# Patient Record
Sex: Female | Born: 1990 | Race: Black or African American | Hispanic: No | Marital: Single | Smoking: Former smoker
Health system: Southern US, Community
[De-identification: ages and names within clinical notes are randomized; demographics above are authoritative.]

## PROBLEM LIST (undated history)

## (undated) DIAGNOSIS — M549 Dorsalgia, unspecified: Secondary | ICD-10-CM

## (undated) HISTORY — PX: EYE SURGERY: SHX253

---

## 2017-01-27 ENCOUNTER — Emergency Department (HOSPITAL_COMMUNITY)
Admission: EM | Admit: 2017-01-27 | Discharge: 2017-01-27 | Disposition: A | Discharge status: Home / Self Care | Payer: No Typology Code available for payment source | Attending: Emergency Medicine | Admitting: Emergency Medicine

## 2017-01-27 ENCOUNTER — Encounter (HOSPITAL_COMMUNITY): Payer: Self-pay | Admitting: *Deleted

## 2017-01-27 DIAGNOSIS — M545 Low back pain: Secondary | ICD-10-CM | POA: Diagnosis not present

## 2017-01-27 DIAGNOSIS — Y939 Activity, unspecified: Secondary | ICD-10-CM | POA: Diagnosis not present

## 2017-01-27 DIAGNOSIS — Y999 Unspecified external cause status: Secondary | ICD-10-CM | POA: Diagnosis not present

## 2017-01-27 DIAGNOSIS — Y92410 Unspecified street and highway as the place of occurrence of the external cause: Secondary | ICD-10-CM | POA: Diagnosis not present

## 2017-01-27 MED ORDER — CYCLOBENZAPRINE HCL 10 MG PO TABS
10.0000 mg | ORAL_TABLET | Freq: Two times a day (BID) | ORAL | 0 refills | Status: AC | PRN
Start: 2017-01-27 — End: ?

## 2017-01-27 NOTE — Discharge Instructions (Signed)
Please read attached information. If you experience any new or worsening signs or symptoms please return to the emergency room for evaluation. Please follow-up with your primary care provider or specialist as discussed. Please use medication prescribed only as directed and discontinue taking if you have any concerning signs or symptoms.   °
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
## 2017-04-24 DIAGNOSIS — Z1159 Encounter for screening for other viral diseases: Secondary | ICD-10-CM | POA: Diagnosis not present

## 2017-04-24 DIAGNOSIS — R87610 Atypical squamous cells of undetermined significance on cytologic smear of cervix (ASC-US): Secondary | ICD-10-CM | POA: Diagnosis not present

## 2017-04-24 DIAGNOSIS — Z01419 Encounter for gynecological examination (general) (routine) without abnormal findings: Secondary | ICD-10-CM | POA: Diagnosis not present

## 2017-04-24 DIAGNOSIS — Z114 Encounter for screening for human immunodeficiency virus [HIV]: Secondary | ICD-10-CM | POA: Diagnosis not present

## 2017-04-24 DIAGNOSIS — Z113 Encounter for screening for infections with a predominantly sexual mode of transmission: Secondary | ICD-10-CM | POA: Diagnosis not present

## 2017-04-24 DIAGNOSIS — Z6825 Body mass index (BMI) 25.0-25.9, adult: Secondary | ICD-10-CM | POA: Diagnosis not present

## 2017-05-30 DIAGNOSIS — R87810 Cervical high risk human papillomavirus (HPV) DNA test positive: Secondary | ICD-10-CM | POA: Diagnosis not present

## 2017-05-30 DIAGNOSIS — Z3200 Encounter for pregnancy test, result unknown: Secondary | ICD-10-CM | POA: Diagnosis not present

## 2017-05-30 DIAGNOSIS — R350 Frequency of micturition: Secondary | ICD-10-CM | POA: Diagnosis not present

## 2017-05-30 DIAGNOSIS — R87610 Atypical squamous cells of undetermined significance on cytologic smear of cervix (ASC-US): Secondary | ICD-10-CM | POA: Diagnosis not present

## 2017-07-29 ENCOUNTER — Emergency Department (HOSPITAL_BASED_OUTPATIENT_CLINIC_OR_DEPARTMENT_OTHER): Payer: Self-pay

## 2017-07-29 ENCOUNTER — Encounter (HOSPITAL_BASED_OUTPATIENT_CLINIC_OR_DEPARTMENT_OTHER): Payer: Self-pay

## 2017-07-29 ENCOUNTER — Emergency Department (HOSPITAL_BASED_OUTPATIENT_CLINIC_OR_DEPARTMENT_OTHER)
Admission: EM | Admit: 2017-07-29 | Discharge: 2017-07-29 | Disposition: A | Discharge status: Home / Self Care | Payer: Self-pay | Attending: Emergency Medicine | Admitting: Emergency Medicine

## 2017-07-29 DIAGNOSIS — M5441 Lumbago with sciatica, right side: Secondary | ICD-10-CM | POA: Insufficient documentation

## 2017-07-29 DIAGNOSIS — F17200 Nicotine dependence, unspecified, uncomplicated: Secondary | ICD-10-CM | POA: Insufficient documentation

## 2017-07-29 DIAGNOSIS — N3000 Acute cystitis without hematuria: Secondary | ICD-10-CM | POA: Insufficient documentation

## 2017-07-29 LAB — CBC WITH DIFFERENTIAL/PLATELET
BASOS PCT: 0 %
Basophils Absolute: 0 10*3/uL (ref 0.0–0.1)
EOS ABS: 0.2 10*3/uL (ref 0.0–0.7)
Eosinophils Relative: 2 %
HCT: 33.2 % — ABNORMAL LOW (ref 36.0–46.0)
Hemoglobin: 11.1 g/dL — ABNORMAL LOW (ref 12.0–15.0)
Lymphocytes Relative: 35 %
Lymphs Abs: 2.3 10*3/uL (ref 0.7–4.0)
MCH: 31.8 pg (ref 26.0–34.0)
MCHC: 33.4 g/dL (ref 30.0–36.0)
MCV: 95.1 fL (ref 78.0–100.0)
MONO ABS: 0.4 10*3/uL (ref 0.1–1.0)
MONOS PCT: 6 %
Neutro Abs: 3.6 10*3/uL (ref 1.7–7.7)
Neutrophils Relative %: 57 %
PLATELETS: 240 10*3/uL (ref 150–400)
RBC: 3.49 MIL/uL — ABNORMAL LOW (ref 3.87–5.11)
RDW: 12 % (ref 11.5–15.5)
WBC: 6.5 10*3/uL (ref 4.0–10.5)

## 2017-07-29 LAB — URINALYSIS, ROUTINE W REFLEX MICROSCOPIC
Bilirubin Urine: NEGATIVE
Glucose, UA: NEGATIVE mg/dL
HGB URINE DIPSTICK: NEGATIVE
KETONES UR: NEGATIVE mg/dL
Nitrite: POSITIVE — AB
PROTEIN: NEGATIVE mg/dL
Specific Gravity, Urine: 1.016 (ref 1.005–1.030)
pH: 7 (ref 5.0–8.0)

## 2017-07-29 LAB — COMPREHENSIVE METABOLIC PANEL
ALBUMIN: 3.6 g/dL (ref 3.5–5.0)
ALT: 16 U/L (ref 14–54)
ANION GAP: 6 (ref 5–15)
AST: 23 U/L (ref 15–41)
Alkaline Phosphatase: 56 U/L (ref 38–126)
BUN: 8 mg/dL (ref 6–20)
CHLORIDE: 105 mmol/L (ref 101–111)
CO2: 25 mmol/L (ref 22–32)
Calcium: 8.8 mg/dL — ABNORMAL LOW (ref 8.9–10.3)
Creatinine, Ser: 0.54 mg/dL (ref 0.44–1.00)
GFR calc Af Amer: >60 mL/min (ref >60)
GFR calc non Af Amer: >60 mL/min (ref >60)
GLUCOSE: 101 mg/dL — AB (ref 65–99)
POTASSIUM: 4.1 mmol/L (ref 3.5–5.1)
SODIUM: 136 mmol/L (ref 135–145)
TOTAL PROTEIN: 6.8 g/dL (ref 6.5–8.1)
Total Bilirubin: 0.4 mg/dL (ref 0.3–1.2)

## 2017-07-29 LAB — URINALYSIS, MICROSCOPIC (REFLEX)

## 2017-07-29 LAB — PREGNANCY, URINE: Preg Test, Ur: NEGATIVE

## 2017-07-29 LAB — LIPASE, BLOOD: Lipase: 26 U/L (ref 11–51)

## 2017-07-29 MED ORDER — CYCLOBENZAPRINE HCL 10 MG PO TABS: 10.0000 mg | ORAL_TABLET | Freq: Two times a day (BID) | ORAL | 0 refills | Status: AC | PRN

## 2017-07-29 MED ORDER — CEPHALEXIN 500 MG PO CAPS: 500.0000 mg | ORAL_CAPSULE | Freq: Two times a day (BID) | ORAL | 0 refills | Status: AC

## 2017-07-29 NOTE — ED Triage Notes (Signed)
Pt noted to have an even steady gate walking in to triage.

## 2017-07-29 NOTE — ED Triage Notes (Signed)
Pt states "my legs get weak and give out on me." Pt reports difficulty with standing. Pt reports pain to R buttocks that radiates down leg. Pt states symptoms has been happening x 4 years, now worse in the past month.
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
## 2017-07-29 NOTE — Discharge Instructions (Signed)
Please take the antibiotics to treat your urinary tract infection. Please use the muscle relaxants to help with your low back pain. I suspect the muscle spasm is contributing to your sciatic nerve pain going down her right leg. Please follow-up with your primary care physician for further management. If any symptoms change or worsen, please return to the nearest emergency department.

## 2017-08-01 LAB — URINE CULTURE: Culture: >=100000 — AB

## 2017-08-02 ENCOUNTER — Telehealth: Payer: Self-pay

## 2017-08-02 NOTE — Telephone Encounter (Signed)
Post ED Visit - Positive Culture Follow-up  Culture report reviewed by antimicrobial stewardship pharmacist:  []  Enzo Bi, Pharm.D. []  Celedonio Miyamoto, Pharm.D., BCPS AQ-ID []  Garvin Fila, Pharm.D., BCPS []  Georgina Pillion, Pharm.D., BCPS []  [place], [address].D., BCPS, AAHIVP []  Estella Husk, Pharm.D., BCPS, AAHIVP []  Lysle Pearl, PharmD, BCPS []  Casilda Carls, PharmD, BCPS []  Pollyann Samples, PharmD, BCPS Erin Deja Pharm D Positive urine culture Treated with Cephaelxin, organism sensitive to the same and no further patient follow-up is required at this time.  Jerry Caras 08/02/2017, 10:19 AM

## 2017-10-23 ENCOUNTER — Ambulatory Visit: Payer: 59 | Admitting: Podiatry

## 2017-10-23 ENCOUNTER — Encounter: Payer: Self-pay | Admitting: Podiatry

## 2017-11-11 ENCOUNTER — Ambulatory Visit: Payer: 59 | Admitting: Podiatry

## 2019-01-30 DIAGNOSIS — Z113 Encounter for screening for infections with a predominantly sexual mode of transmission: Secondary | ICD-10-CM | POA: Diagnosis not present

## 2019-01-30 DIAGNOSIS — Z124 Encounter for screening for malignant neoplasm of cervix: Secondary | ICD-10-CM | POA: Diagnosis not present

## 2019-02-03 DIAGNOSIS — Z124 Encounter for screening for malignant neoplasm of cervix: Secondary | ICD-10-CM | POA: Diagnosis not present

## 2019-02-20 DIAGNOSIS — N390 Urinary tract infection, site not specified: Secondary | ICD-10-CM | POA: Diagnosis not present

## 2019-02-20 DIAGNOSIS — R87612 Low grade squamous intraepithelial lesion on cytologic smear of cervix (LGSIL): Secondary | ICD-10-CM | POA: Diagnosis not present

## 2019-06-01 DIAGNOSIS — Z6825 Body mass index (BMI) 25.0-25.9, adult: Secondary | ICD-10-CM | POA: Diagnosis not present

## 2019-06-01 DIAGNOSIS — R87612 Low grade squamous intraepithelial lesion on cytologic smear of cervix (LGSIL): Secondary | ICD-10-CM | POA: Diagnosis not present

## 2019-06-01 DIAGNOSIS — Z3202 Encounter for pregnancy test, result negative: Secondary | ICD-10-CM | POA: Diagnosis not present

## 2019-06-01 DIAGNOSIS — N870 Mild cervical dysplasia: Secondary | ICD-10-CM | POA: Diagnosis not present

## 2020-12-20 ENCOUNTER — Emergency Department (HOSPITAL_BASED_OUTPATIENT_CLINIC_OR_DEPARTMENT_OTHER): Payer: Managed Care, Other (non HMO)

## 2020-12-20 ENCOUNTER — Emergency Department (HOSPITAL_BASED_OUTPATIENT_CLINIC_OR_DEPARTMENT_OTHER)
Admission: EM | Admit: 2020-12-20 | Discharge: 2020-12-20 | Disposition: A | Discharge status: Home / Self Care | Payer: Managed Care, Other (non HMO) | Attending: Emergency Medicine | Admitting: Emergency Medicine

## 2020-12-20 ENCOUNTER — Other Ambulatory Visit: Payer: Self-pay

## 2020-12-20 ENCOUNTER — Encounter (HOSPITAL_BASED_OUTPATIENT_CLINIC_OR_DEPARTMENT_OTHER): Payer: Self-pay

## 2020-12-20 DIAGNOSIS — U071 COVID-19: Secondary | ICD-10-CM | POA: Insufficient documentation

## 2020-12-20 DIAGNOSIS — Z87891 Personal history of nicotine dependence: Secondary | ICD-10-CM | POA: Diagnosis not present

## 2020-12-20 DIAGNOSIS — Z20822 Contact with and (suspected) exposure to covid-19: Secondary | ICD-10-CM

## 2020-12-20 DIAGNOSIS — R059 Cough, unspecified: Secondary | ICD-10-CM | POA: Diagnosis present

## 2020-12-20 DIAGNOSIS — B349 Viral infection, unspecified: Secondary | ICD-10-CM

## 2020-12-20 HISTORY — DX: Dorsalgia, unspecified: M54.9

## 2020-12-20 MED ORDER — KETOROLAC TROMETHAMINE 60 MG/2ML IM SOLN
30.0000 mg | Freq: Once | INTRAMUSCULAR | Status: AC
Start: 2020-12-20 — End: 2020-12-20
  Administered 2020-12-20: 30 mg via INTRAMUSCULAR
  Filled 2020-12-20: qty 2

## 2020-12-20 NOTE — ED Triage Notes (Signed)
Pt c/o flu like sx x 2 weeks-neg covid test 12/24-NAD-steady gait

## 2020-12-20 NOTE — ED Notes (Signed)
For the past 4 days having chills, prod cough, body aches, having pressure behind the eyes. Prod cough of thick yellow secretions. Able to tolerate POs well.

## 2020-12-20 NOTE — Discharge Instructions (Signed)
Please take the anti-inflammatories for your pain and body aches.  If you develop worsening chest pain, any difficulty in breathing or other new concerning symptom, return to the emergency room for reassessment.

## 2020-12-20 NOTE — ED Notes (Signed)
Has not rec Covid Vaccines / or Flu Shot

## 2020-12-21 LAB — SARS CORONAVIRUS 2 (TAT 6-24 HRS): SARS Coronavirus 2: POSITIVE — AB

## 2020-12-21 NOTE — ED Provider Notes (Signed)
[facility] EMERGENCY DEPARTMENT Provider Note   CSN: [id number] Arrival date & time: 12/20/20  1240     History Chief Complaint  Patient presents with  . Generalized Body Aches  . Cough    Jamie Harper is a 30 y.o. female.  Over the past few days she has had chills, body aches and occasional headache.  Describes headache as mild to moderate, not sudden onset, not worst headache of her life.  Has been having cough, mostly nonproductive but occasionally has clear or slightly yellow production.  No nausea or vomiting.  Unsure of any fever at home.  Unvaccinated against flu or Covid.  Denies medical problems.  HPI     Past Medical History:  Diagnosis Date  . Back pain     There are no problems to display for this patient.   Past Surgical History:  Procedure Laterality Date  . EYE SURGERY    . EYE SURGERY       OB History   No obstetric history on file.     No family history on file.  Social History   Tobacco Use  . Smoking status: Former Games developer  . Smokeless tobacco: Never Used  Substance Use Topics  . Alcohol use: Yes  . Drug use: No    Home Medications Prior to Admission medications   Medication Sig Start Date End Date Taking? Authorizing Provider  cyclobenzaprine (FLEXERIL) 10 MG tablet Take 1 tablet (10 mg total) by mouth 2 (two) times daily as needed for muscle spasms. 01/27/17   Hedges, Tinnie Gens, PA-C  cyclobenzaprine (FLEXERIL) 10 MG tablet Take 1 tablet (10 mg total) by mouth 2 (two) times daily as needed for muscle spasms. 07/29/17   Tegeler, Canary Brim, MD    Allergies    Patient has no known allergies.  Review of Systems   Review of Systems  Constitutional: Positive for chills, fatigue and fever.  HENT: Negative for ear pain and sore throat.   Eyes: Negative for pain and visual disturbance.  Respiratory: Negative for cough and shortness of breath.   Cardiovascular: Negative for chest pain and palpitations.  Gastrointestinal:  Negative for abdominal pain and vomiting.  Genitourinary: Negative for dysuria and hematuria.  Musculoskeletal: Positive for arthralgias and myalgias. Negative for back pain.  Skin: Negative for color change and rash.  Neurological: Negative for seizures and syncope.  All other systems reviewed and are negative.   Physical Exam Updated Vital Signs BP 101/76 (BP Location: Left Arm)   Pulse 74   Temp 98.4 F (36.9 C) (Oral)   Resp 16   Ht 5\' 4"  (1.626 m)   Wt 77.6 kg   LMP 11/16/2020   SpO2 99%   BMI 29.35 kg/m   Physical Exam Vitals and nursing note reviewed.  Constitutional:      General: She is not in acute distress.    Appearance: She is well-developed and well-nourished.  HENT:     Head: Normocephalic and atraumatic.  Eyes:     Conjunctiva/sclera: Conjunctivae normal.  Cardiovascular:     Rate and Rhythm: Normal rate and regular rhythm.     Heart sounds: No murmur heard.   Pulmonary:     Effort: Pulmonary effort is normal. No respiratory distress.     Breath sounds: Normal breath sounds.  Abdominal:     Palpations: Abdomen is soft.     Tenderness: There is no abdominal tenderness.  Musculoskeletal:        General: No edema.  Cervical back: Neck supple.  Skin:    General: Skin is warm and dry.  Neurological:     Mental Status: She is alert.  Psychiatric:        Mood and Affect: Mood and affect and mood normal.        Behavior: Behavior normal.     ED Results / Procedures / Treatments   Labs (all labs ordered are listed, but only abnormal results are displayed) Labs Reviewed  SARS CORONAVIRUS 2 (TAT 6-24 HRS)    EKG EKG Interpretation  Date/Time:  Tuesday December 20 2020 17:53:16 EST Ventricular Rate:  67 PR Interval:  136 QRS Duration: 86 QT Interval:  406 QTC Calculation: 429 R Axis:   19 Text Interpretation: Normal sinus rhythm Nonspecific T wave abnormality Abnormal ECG Confirmed by Marianna Fuss ([id number]) on 12/20/2020 6:04:42  PM   Radiology DG Chest Portable 1 View  Result Date: 12/20/2020 CLINICAL DATA:  Chills with productive cough and body aches. EXAM: PORTABLE CHEST 1 VIEW COMPARISON:  None. FINDINGS: The heart size and mediastinal contours are within normal limits. Both lungs are clear. The visualized skeletal structures are unremarkable. IMPRESSION: No active disease. Electronically Signed   By: Aram Candela M.D.   On: 12/20/2020 18:13    Procedures Procedures (including critical care time)  Medications Ordered in ED Medications  ketorolac (TORADOL) injection 30 mg (30 mg Intramuscular Given 12/20/20 1818)    ED Course  I have reviewed the triage vital signs and the nursing notes.  Pertinent labs & imaging results that were available during my care of the patient were reviewed by me and considered in my medical decision making (see chart for details).    MDM Rules/Calculators/A&P                         30 year old presenting to ER with.  Symptoms concerning for acute viral process.  High suspicion for COVID-19.  Will send for Covid testing.  Instructed patient to follow-up via MyChart for results.  Counseled on return precautions and isolation precautions.  She is remarkably well-appearing at present, vital signs are all stable and believe she is appropriate for discharge and outpatient management.    After the discussed management above, the patient was determined to be safe for discharge.  The patient was in agreement with this plan and all questions regarding their care were answered.  ED return precautions were discussed and the patient will return to the ED with any significant worsening of condition.  Jamie Harper was evaluated in Emergency Department on 12/21/2020 for the symptoms described in the history of present illness. She was evaluated in the context of the global COVID-19 pandemic, which necessitated consideration that the patient might be at risk for infection with the SARS-CoV-2  virus that causes COVID-19. Institutional protocols and algorithms that pertain to the evaluation of patients at risk for COVID-19 are in a state of rapid change based on information released by regulatory bodies including the CDC and federal and state organizations. These policies and algorithms were followed during the patient's care in the ED.  Final Clinical Impression(s) / ED Diagnoses Final diagnoses:  Viral syndrome  Suspected COVID-19 virus infection    Rx / DC Orders ED Discharge Orders    None       Milagros Loll, MD 12/21/20 1049

## 2021-11-16 IMAGING — DX DG CHEST 1V PORT
1 series · 1 of 1 positions shown · non-contrast
Comparison: None.

CLINICAL DATA: Chills with productive cough and body aches.

EXAM:
PORTABLE CHEST 1 VIEW

[chest ap]
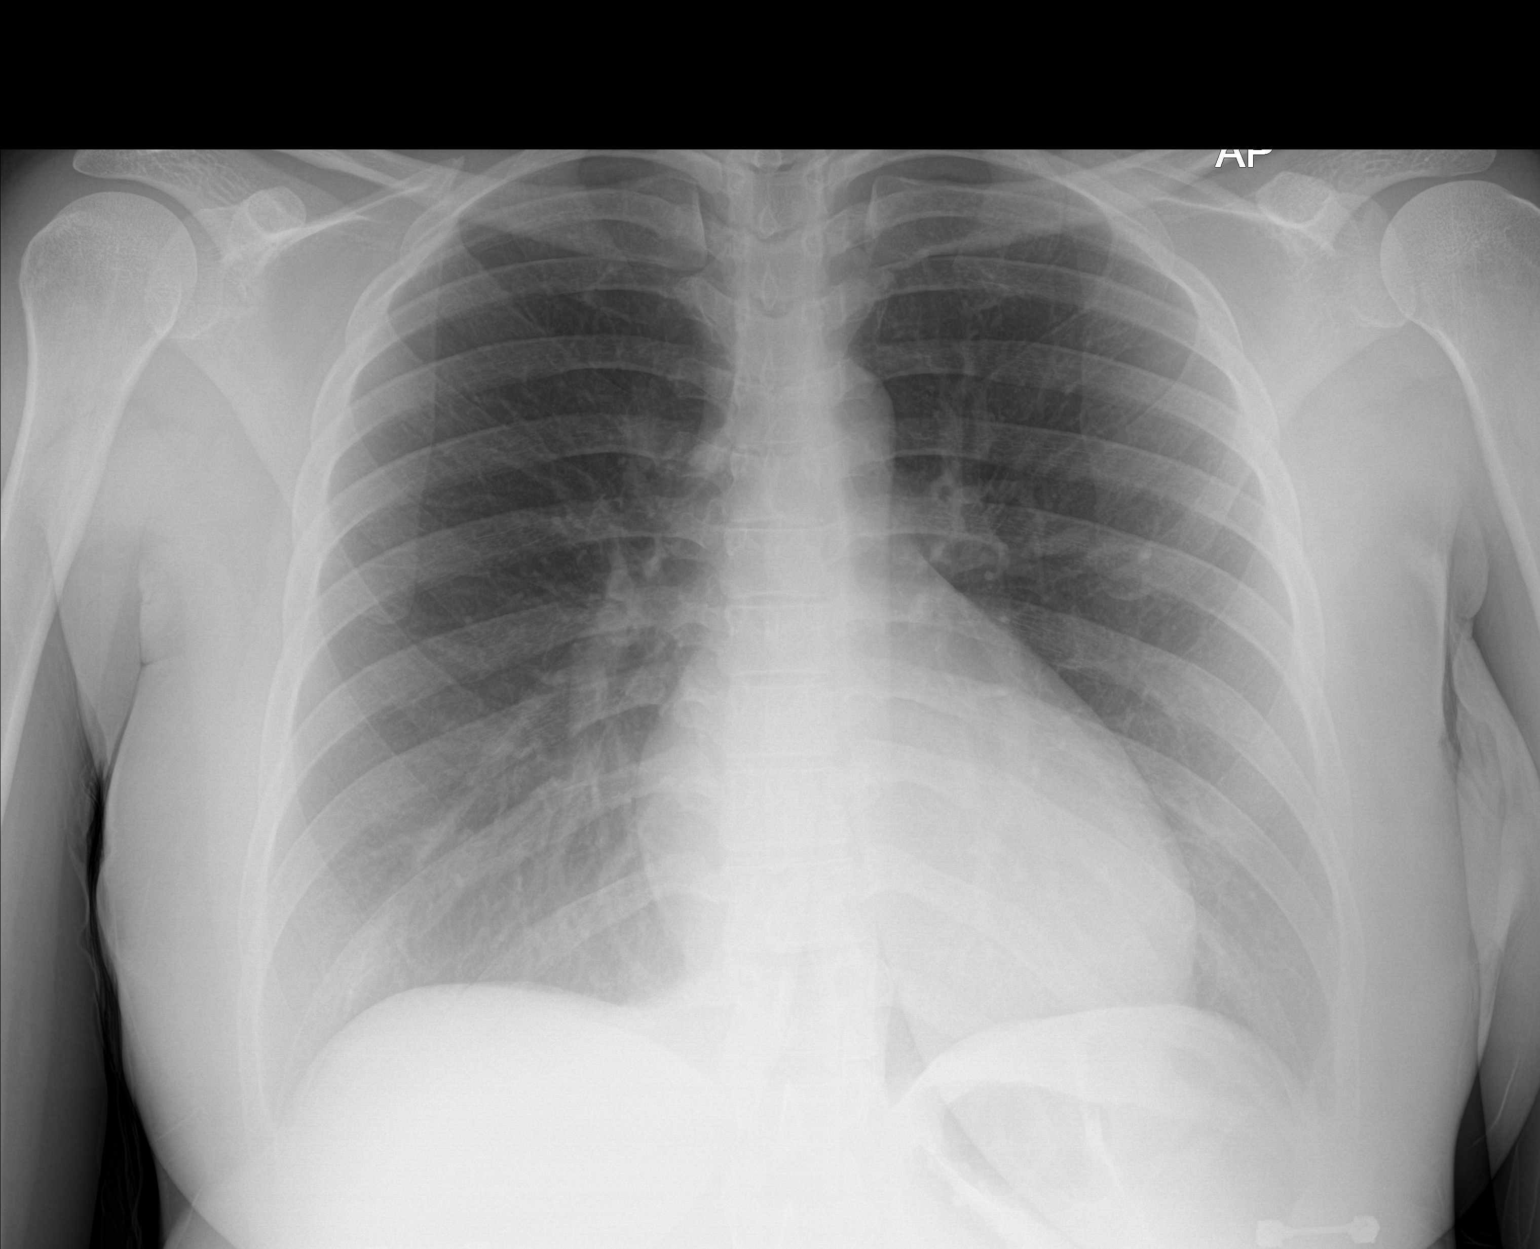

[1 of 1 positions shown; findings below may reference images not displayed]

FINDINGS: The heart size and mediastinal contours are within normal limits.
Both lungs are clear. The visualized skeletal structures are
unremarkable.
IMPRESSION: No active disease.
